# Patient Record
Sex: Female | Born: 1955 | Race: White | Hispanic: No | Smoking: Never smoker
Health system: Southern US, Community
[De-identification: ages and names within clinical notes are randomized; demographics above are authoritative.]

## PROBLEM LIST (undated history)

## (undated) DIAGNOSIS — F32A Depression, unspecified: Secondary | ICD-10-CM

## (undated) DIAGNOSIS — N3281 Overactive bladder: Secondary | ICD-10-CM

## (undated) DIAGNOSIS — E119 Type 2 diabetes mellitus without complications: Secondary | ICD-10-CM

## (undated) DIAGNOSIS — I700 Atherosclerosis of aorta: Secondary | ICD-10-CM

## (undated) HISTORY — DX: Type 2 diabetes mellitus without complications: E11.9

## (undated) HISTORY — DX: Overactive bladder: N32.81

## (undated) HISTORY — DX: Atherosclerosis of aorta: I70.0

## (undated) HISTORY — DX: Depression, unspecified: F32.A

---

## 1997-08-16 ENCOUNTER — Encounter: Admission: RE | Admit: 1997-08-16 | Discharge: 1997-08-16 | Payer: Self-pay | Admitting: Family Medicine

## 1997-08-28 ENCOUNTER — Encounter: Admission: RE | Admit: 1997-08-28 | Discharge: 1997-08-28 | Payer: Self-pay | Admitting: Family Medicine

## 1997-12-08 ENCOUNTER — Encounter: Admission: RE | Admit: 1997-12-08 | Discharge: 1997-12-08 | Payer: Self-pay | Admitting: Family Medicine

## 1998-01-11 ENCOUNTER — Ambulatory Visit (HOSPITAL_COMMUNITY): Admission: RE | Admit: 1998-01-11 | Discharge: 1998-01-11 | Payer: Self-pay | Admitting: Family Medicine

## 1999-02-21 ENCOUNTER — Other Ambulatory Visit: Admission: RE | Admit: 1999-02-21 | Discharge: 1999-02-21 | Payer: Self-pay | Admitting: Obstetrics & Gynecology

## 1999-10-11 ENCOUNTER — Encounter: Payer: Self-pay | Admitting: Family Medicine

## 1999-10-11 ENCOUNTER — Ambulatory Visit (HOSPITAL_COMMUNITY): Admission: RE | Admit: 1999-10-11 | Discharge: 1999-10-11 | Payer: Self-pay | Admitting: Family Medicine

## 2000-05-28 ENCOUNTER — Other Ambulatory Visit: Admission: RE | Admit: 2000-05-28 | Discharge: 2000-05-28 | Payer: Self-pay | Admitting: Obstetrics & Gynecology

## 2001-05-31 ENCOUNTER — Other Ambulatory Visit: Admission: RE | Admit: 2001-05-31 | Discharge: 2001-05-31 | Payer: Self-pay | Admitting: Obstetrics & Gynecology

## 2002-06-15 ENCOUNTER — Other Ambulatory Visit: Admission: RE | Admit: 2002-06-15 | Discharge: 2002-06-15 | Payer: Self-pay | Admitting: Obstetrics & Gynecology

## 2003-07-24 ENCOUNTER — Other Ambulatory Visit: Admission: RE | Admit: 2003-07-24 | Discharge: 2003-07-24 | Payer: Self-pay | Admitting: Obstetrics & Gynecology

## 2004-08-14 ENCOUNTER — Other Ambulatory Visit: Admission: RE | Admit: 2004-08-14 | Discharge: 2004-08-14 | Payer: Self-pay | Admitting: Obstetrics & Gynecology

## 2006-10-13 ENCOUNTER — Encounter: Admission: RE | Admit: 2006-10-13 | Discharge: 2006-10-13 | Payer: Self-pay | Admitting: Obstetrics & Gynecology

## 2007-06-16 ENCOUNTER — Observation Stay (HOSPITAL_COMMUNITY): Admission: EM | Admit: 2007-06-16 | Discharge: 2007-06-17 | Payer: Self-pay | Admitting: Emergency Medicine

## 2007-11-17 ENCOUNTER — Encounter: Admission: RE | Admit: 2007-11-17 | Discharge: 2007-11-17 | Payer: Self-pay | Admitting: Obstetrics & Gynecology

## 2008-12-07 ENCOUNTER — Encounter: Admission: RE | Admit: 2008-12-07 | Discharge: 2008-12-07 | Payer: Self-pay | Admitting: Obstetrics & Gynecology

## 2009-11-20 ENCOUNTER — Emergency Department (HOSPITAL_COMMUNITY): Admission: EM | Admit: 2009-11-20 | Discharge: 2009-11-21 | Payer: Self-pay | Admitting: Emergency Medicine

## 2010-02-18 ENCOUNTER — Encounter: Admission: RE | Admit: 2010-02-18 | Discharge: 2010-02-18 | Payer: Self-pay | Admitting: Obstetrics & Gynecology

## 2010-05-30 LAB — BASIC METABOLIC PANEL
BUN: 20 mg/dL (ref 6–23)
Chloride: 104 mEq/L (ref 96–112)
GFR calc non Af Amer: >60 mL/min (ref >60)
Glucose, Bld: 142 mg/dL — ABNORMAL HIGH (ref 70–99)
Potassium: 3.5 mEq/L (ref 3.5–5.1)

## 2010-05-30 LAB — DIFFERENTIAL
Basophils Absolute: 0.1 10*3/uL (ref 0.0–0.1)
Basophils Relative: 1 % (ref 0–1)
Eosinophils Relative: 4 % (ref 0–5)
Monocytes Absolute: 1 10*3/uL (ref 0.1–1.0)

## 2010-05-30 LAB — CBC
HCT: 38.1 % (ref 36.0–46.0)
MCHC: 33.8 g/dL (ref 30.0–36.0)
MCV: 87.1 fL (ref 78.0–100.0)
RDW: 14.3 % (ref 11.5–15.5)

## 2010-07-30 NOTE — Discharge Summary (Signed)
Brittany Brady, Brittany Brady NO.:  [id number]   MEDICAL RECORD NO.:  [id number]          PATIENT TYPE:  INP   LOCATION:  6524                         FACILITY:  [facility]   PHYSICIAN:  Jake Bathe, MD      DATE OF BIRTH:  02-04-1956   DATE OF ADMISSION:  06/16/2007  DATE OF DISCHARGE:  06/17/2007                               DISCHARGE SUMMARY   DISCHARGE DIAGNOSES:  1. Chest pain - noncardiac.  2. Hypertension.  3. Hyperlipidemia - LDL 122.  4. Strong family history of coronary artery disease.  5. Depression, anxiety.  6. Gastroesophageal reflux disease.  7. 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  Pain lasted 10-15 minutes, subsided, was  4/10 when EMS arrived, and was alleviated when I saw her in the  emergency department.   All 3 sets of cardiac biomarkers were normal.  EKG was reassuring and  normal x2.  Nuclear stress test, low risk as above.  D-dimer was normal.   This morning feels good, no further chest pain, just tired, able to  ambulate well, no shortness of breath, no syncope .  On telemetry 2  PVCs/couplet.   PHYSICAL EXAMINATION:  VITAL SIGNS:  Blood pressure on discharge 121/64,  heart rate 80, respirations 27, sating 97% on room air, afebrile.  GENERAL:  Alert and oriented x3.  No acute distress.  CARDIOVASCULAR:  Regular rate and rhythm.  No murmurs, rubs, or gallops.  LUNGS:  Clear to auscultation bilaterally.  Normal respiratory effort.  ABDOMEN:  Soft, nontender.  Normoactive bowel sounds.  EXTREMITIES:  No clubbing, cyanosis, or edema.   LABORATORY DATA:  As above, hemoglobin 12.9, hematocrit 38.0, creatinine  1.1, and  potassium 3.3 slightly low.   DISCHARGE MEDICATIONS:  1. Aspirin 81 mg once a day none.  2. Metoprolol 25 mg twice a day (new).  3. Benicar HCT 40/25 mg once a day.  4. Zocor 20 mg p.o. q.h.s. (new).  5. Protonix 40 mg once a day (new).  6. Potassium chloride 20 mEq p.o. daily (new).   DISCHARGE INSTRUCTIONS:  No restrictions on activity.   FOLLOWUP:  She has a followup appointment on Wednesday, June 30, 2007,  10:15 a.m. with Dr. Donato Schultz at [facility].  She needs to call  me if any other worrisome symptoms occur.      Jake Bathe, MD  Electronically Signed     MCS/MEDQ  D:  06/17/2007  T:  06/17/2007  Job:  [id number]   cc:   Elana Alm. [name], M.D.

## 2010-07-30 NOTE — H&P (Signed)
Brittany Brady, BARGA NO.:  [id number]   MEDICAL RECORD NO.:  [id number]          PATIENT TYPE:  EMS   LOCATION:  MAJO                         FACILITY:  [facility]   PHYSICIAN:  Jake Bathe, MD      DATE OF BIRTH:  Nov 06, 1955   DATE OF ADMISSION:  06/16/2007  DATE OF DISCHARGE:                              HISTORY & PHYSICAL   PRIMARY CARE PHYSICIAN:  Brittany Brady, M.D.   REASON FOR ADMISSION:  Possible Unstable angina.   HISTORY OF PRESENT ILLNESS:  A 55 year old female with hypertension,  early family history of coronary artery disease with her mother dying at  age 41 from presumed aortic aneurysm, GERD, depression, neuropathy who  had sudden onset, 8/10, substernal chest pain which radiated to both of  her shoulders bilaterally occurring today while standing at work.  She  sat down, noted some mild shortness of breath, and called 911.  After  about 10 to 15 minutes, pain subsided to a 4/10 when EMS arrived.  She  was given appropriate aspirin and nitroglycerin and currently, she is  chest pain free with only mild fleeting episodes of discomfort.  Shortness of breath is relieved.   In October 2008, she had a stress Cardiolite where she exercised for 7  minutes 6 seconds with normal blood pressure response and no ischemic  changes on EKG.  The overall risk of the test was low with no myocardial  perfusion defects.  She also had echocardiogram which showed normal  ejection fraction, diastolic dysfunction, and mild mitral regurgitation  with normal aortic valve.   PAST MEDICAL HISTORY:  1. Hypertension.  2. GERD.  3. 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  4. Depression.   ALLERGIES:  No known drug allergies.   MEDICATIONS:  1. Oral contraceptive pill.  2. Benicar HCT 40/25 mg once a day.  3. Cymbalta 60 mg once a day.  4. Ranitidine 300 mg twice a day.  5. Antihistamine occasionally.   SOCIAL HISTORY:  Denies any tobacco use.  Very  infrequent alcohol use.  No illicit drug use.  She works as an Psychologist, forensic.  Her  husband died on the job as a Psychologist, counselling.  Her mother died  during the week of her honeymoon in 11.  She has 2 girls in college.   FAMILY HISTORY:  Mother, possible aortic aneurysm 69, died at age 41.  Both of her brothers have diabetes mellitus.  Father died of lung cancer  earlier in 2008.   REVIEW OF SYSTEMS:  No bleeding.  Occasional fatigue.  Unless stated,  above all other 12 review of systems negative.  No headaches.   PHYSICAL EXAMINATION:  VITAL SIGNS: Temperature 98, on arrival pulse 93,  blood pressure 147/96, saturating 97% on room air.  GENERAL: Alert and oriented x3.  No acute distress.  Lying comfortably  in bed, here with her friend.  EYES: Well-perfused conjunctivae, EOMI.  No scleral icterus.  NECK:  Supple.  No JVD.  No carotid bruits.  No thyromegaly.  CARDIOVASCULAR:  Regular rate  and rhythm with no murmurs, rubs, or  gallops.  No RV heave.  Normal PMI.  LUNGS:  Clear to auscultation bilaterally.  Normal respiratory effort.  ABDOMEN:  Soft and nontender.  Normal active bowel sounds.  No rebound.  No guarding.  No bruits.  EXTREMITIES:  No clubbing, cyanosis, or edema.  Normal distal pulses.  NEUROLOGIC:  Nonfocal.  No tremors.  SKIN:  Warm, dry, and intact.  No rashes.   DATA:  EKG here demonstrates normal sinus rhythm with no other  abnormalities.  Heart rate 91.  EKG from office demonstrated borderline  LVH, was otherwise normal.  Nuclear stress as above, low risk.  Echo,  normal EF.  Recent LDL 128, triglycerides 111, HDL 69.  TSH was normal.  Hemoglobin 13.  Here in ER, D-dimer pending.  First set of cardiac  biomarkers normal.  PT  and PTT normal.  Potassium was 3.3.  Chest x-ray  personally reviewed shows possible bibasilar atelectasis with no other  acute airspace disease.   ASSESSMENT/PLAN:  A 55 year old female with early family history  of  cardiovascular disease, hypertension with chest pain concerning for  possible unstable angina.  1. Possible Unstable angina - Given risk factors, we will place on      Lovenox q.12 hour, ACS dose.  Previous nuclear stress test      reassuring in October.  We will continue to cycle cardiac enzymes      to detect any evidence of myocardial infarction.  EKG currently      reassuring.  We will make NPO past midnight in case further testing      is required.  Nitroglycerin p.r.n., oxygen.  We will also start low-      dose beta-blocker, metoprolol 25 mg twice a day.  Dyspnea and chest      pain are both improved.  I will order D-dimer.  I believe she is      quite low likelihood for pulmonary embolism.  2. Hypertension - We will continue Benicar HCT, added metoprolol 25 mg      twice a day.  3. Hypokalemia - Potassium replacement administered.  4. Gastroesophageal reflux disease - We will place on Protonix instead      of  ranitidine.  5. We will follow up with lab results.      Jake Bathe, MD  Electronically Signed     MCS/MEDQ  D:  06/16/2007  T:  06/17/2007  Job:  [id number]   cc:   Brittany Brady, M.D.

## 2010-12-10 LAB — POCT CARDIAC MARKERS: CKMB, poc: <1 — ABNORMAL LOW

## 2010-12-10 LAB — CARDIAC PANEL(CRET KIN+CKTOT+MB+TROPI)
CK, MB: 1.2
Troponin I: 0.01

## 2010-12-10 LAB — CBC
HCT: 39.2
MCV: 89.2
Platelets: 242
WBC: 7

## 2010-12-10 LAB — POCT I-STAT, CHEM 8
Creatinine, Ser: 1.1
Glucose, Bld: 98
Hemoglobin: 12.9
TCO2: 28

## 2010-12-10 LAB — CK TOTAL AND CKMB (NOT AT ARMC)
CK, MB: 1.3
Total CK: 72

## 2010-12-10 LAB — TROPONIN I: Troponin I: <0.01

## 2011-02-19 ENCOUNTER — Other Ambulatory Visit: Payer: Self-pay | Admitting: Obstetrics & Gynecology

## 2011-02-19 DIAGNOSIS — Z1231 Encounter for screening mammogram for malignant neoplasm of breast: Secondary | ICD-10-CM

## 2011-02-25 ENCOUNTER — Ambulatory Visit
Admission: RE | Admit: 2011-02-25 | Discharge: 2011-02-25 | Disposition: A | Discharge status: Home / Self Care | Payer: BC Managed Care – PPO | Source: Ambulatory Visit | Attending: Obstetrics & Gynecology | Admitting: Obstetrics & Gynecology

## 2011-02-25 DIAGNOSIS — Z1231 Encounter for screening mammogram for malignant neoplasm of breast: Secondary | ICD-10-CM

## 2012-01-20 ENCOUNTER — Other Ambulatory Visit: Payer: Self-pay | Admitting: Obstetrics & Gynecology

## 2012-01-20 DIAGNOSIS — Z1231 Encounter for screening mammogram for malignant neoplasm of breast: Secondary | ICD-10-CM

## 2012-02-26 ENCOUNTER — Ambulatory Visit
Admission: RE | Admit: 2012-02-26 | Discharge: 2012-02-26 | Disposition: A | Discharge status: Home / Self Care | Payer: BC Managed Care – PPO | Source: Ambulatory Visit | Attending: Obstetrics & Gynecology | Admitting: Obstetrics & Gynecology

## 2012-02-26 DIAGNOSIS — Z1231 Encounter for screening mammogram for malignant neoplasm of breast: Secondary | ICD-10-CM

## 2013-02-15 ENCOUNTER — Other Ambulatory Visit: Payer: Self-pay

## 2013-02-15 DIAGNOSIS — Z1231 Encounter for screening mammogram for malignant neoplasm of breast: Secondary | ICD-10-CM

## 2013-03-18 ENCOUNTER — Ambulatory Visit
Admission: RE | Admit: 2013-03-18 | Discharge: 2013-03-18 | Disposition: A | Discharge status: Home / Self Care | Payer: BC Managed Care – PPO | Source: Ambulatory Visit

## 2013-03-18 DIAGNOSIS — Z1231 Encounter for screening mammogram for malignant neoplasm of breast: Secondary | ICD-10-CM

## 2014-03-22 ENCOUNTER — Other Ambulatory Visit: Payer: Self-pay

## 2014-03-22 DIAGNOSIS — Z1231 Encounter for screening mammogram for malignant neoplasm of breast: Secondary | ICD-10-CM

## 2014-04-06 ENCOUNTER — Ambulatory Visit
Admission: RE | Admit: 2014-04-06 | Discharge: 2014-04-06 | Disposition: A | Discharge status: Home / Self Care | Payer: BC Managed Care – PPO | Source: Ambulatory Visit

## 2014-04-06 DIAGNOSIS — Z1231 Encounter for screening mammogram for malignant neoplasm of breast: Secondary | ICD-10-CM

## 2014-05-26 ENCOUNTER — Other Ambulatory Visit (HOSPITAL_COMMUNITY): Payer: Self-pay | Admitting: Orthopaedic Surgery

## 2014-05-26 DIAGNOSIS — M25561 Pain in right knee: Secondary | ICD-10-CM

## 2014-06-13 ENCOUNTER — Ambulatory Visit (HOSPITAL_COMMUNITY)
Admission: RE | Admit: 2014-06-13 | Discharge: 2014-06-13 | Disposition: A | Discharge status: Home / Self Care | Payer: BC Managed Care – PPO | Source: Ambulatory Visit | Attending: Orthopaedic Surgery | Admitting: Orthopaedic Surgery

## 2014-06-13 DIAGNOSIS — X58XXXA Exposure to other specified factors, initial encounter: Secondary | ICD-10-CM | POA: Diagnosis not present

## 2014-06-13 DIAGNOSIS — S83241A Other tear of medial meniscus, current injury, right knee, initial encounter: Secondary | ICD-10-CM | POA: Insufficient documentation

## 2014-06-13 DIAGNOSIS — M25561 Pain in right knee: Secondary | ICD-10-CM | POA: Diagnosis present

## 2015-02-23 ENCOUNTER — Other Ambulatory Visit: Payer: Self-pay

## 2015-02-23 DIAGNOSIS — Z1231 Encounter for screening mammogram for malignant neoplasm of breast: Secondary | ICD-10-CM

## 2015-04-09 ENCOUNTER — Ambulatory Visit
Admission: RE | Admit: 2015-04-09 | Discharge: 2015-04-09 | Disposition: A | Discharge status: Home / Self Care | Payer: BC Managed Care – PPO | Source: Ambulatory Visit

## 2015-04-09 DIAGNOSIS — Z1231 Encounter for screening mammogram for malignant neoplasm of breast: Secondary | ICD-10-CM

## 2016-05-27 ENCOUNTER — Other Ambulatory Visit: Payer: Self-pay | Admitting: Obstetrics & Gynecology

## 2016-05-27 DIAGNOSIS — Z1231 Encounter for screening mammogram for malignant neoplasm of breast: Secondary | ICD-10-CM

## 2016-06-16 ENCOUNTER — Ambulatory Visit
Admission: RE | Admit: 2016-06-16 | Discharge: 2016-06-16 | Disposition: A | Discharge status: Home / Self Care | Payer: BC Managed Care – PPO | Source: Ambulatory Visit | Attending: Obstetrics & Gynecology | Admitting: Obstetrics & Gynecology

## 2016-06-16 DIAGNOSIS — Z1231 Encounter for screening mammogram for malignant neoplasm of breast: Secondary | ICD-10-CM

## 2016-09-24 ENCOUNTER — Ambulatory Visit (INDEPENDENT_AMBULATORY_CARE_PROVIDER_SITE_OTHER): Payer: Self-pay

## 2016-09-24 ENCOUNTER — Encounter (INDEPENDENT_AMBULATORY_CARE_PROVIDER_SITE_OTHER): Payer: Self-pay | Admitting: Orthopedic Surgery

## 2016-09-24 ENCOUNTER — Ambulatory Visit (INDEPENDENT_AMBULATORY_CARE_PROVIDER_SITE_OTHER): Payer: BC Managed Care – PPO | Admitting: Orthopedic Surgery

## 2016-09-24 VITALS — BP 130/80 | HR 92 | Ht 65.0 in | Wt 185.0 lb

## 2016-09-24 DIAGNOSIS — M1711 Unilateral primary osteoarthritis, right knee: Secondary | ICD-10-CM

## 2016-09-24 DIAGNOSIS — M25561 Pain in right knee: Secondary | ICD-10-CM

## 2016-09-24 DIAGNOSIS — G8929 Other chronic pain: Secondary | ICD-10-CM

## 2016-09-24 MED ORDER — BUPIVACAINE HCL 0.5 % IJ SOLN
3.0000 mL | INTRAMUSCULAR | Status: AC | PRN
  Administered 2016-09-24: 3 mL via INTRA_ARTICULAR

## 2016-09-24 MED ORDER — METHYLPREDNISOLONE ACETATE 40 MG/ML IJ SUSP
80.0000 mg | INTRAMUSCULAR | Status: AC | PRN
  Administered 2016-09-24: 80 mg

## 2016-09-24 MED ORDER — LIDOCAINE HCL 2 % IJ SOLN
4.0000 mL | INTRAMUSCULAR | Status: AC | PRN
  Administered 2016-09-24: 4 mL

## 2016-09-24 NOTE — Progress Notes (Signed)
Office Visit Note   Patient: Brittany Brady           Date of Birth: Jul 29, 1955           MRN: [id number] Visit Date: 09/24/2016              Requested by: Brittany Elseeade, Robert, MD [phone] Brittany Brady [phone] PCP: Brittany Elseeade, Robert, MD   Assessment & Plan: Visit Diagnoses:  1. Unilateral primary osteoarthritis, right knee   2. Chronic pain of right knee     Plan:  #1: Corticosteroid injection to the right knee #2: Follow back up when necessary  Follow-Up Instructions: Return if symptoms worsen or fail to improve.   Orders:  Orders Placed This Encounter  Procedures  . XR Knee Complete 4 Views Right   No orders of the defined types were placed in this encounter.     Procedures: Large Joint Inj Date/Time: 09/24/2016 3:10 PM Performed by: Brittany Brady Authorized by: Brittany Brady   Consent Given by:  Patient Timeout: prior to procedure the correct patient, procedure, and site was verified   Indications:  Pain and joint swelling Location:  Knee Site:  R knee Prep: patient was prepped and draped in usual sterile fashion   Needle Size:  25 G Needle Length:  1.5 inches Approach:  Anteromedial Ultrasound Guidance: No   Fluoroscopic Guidance: No   Arthrogram: No   Medications:  80 mg methylPREDNISolone acetate 40 MG/ML; 3 mL bupivacaine 0.5 %; 4 mL lidocaine 2 % Aspiration Attempted: No   Patient tolerance:  Patient tolerated the procedure well with no immediate complications     Clinical Data: No additional findings.   Subjective: Chief Complaint  Patient presents with  . Right Knee - Pain  . Injections    Right knee pain 2 months. takes naproxen with some help. previous injections were helpful. no new injury.     Brittany Brady is a very pleasant 61 year old white female who presents today with right knee pain. She's had this over the past 2 months has been taking naproxen 2 tablets twice a day which has some benefit. She however is having  worsening pain and would like to consider having a corticosteroid injection to the right knee. According to the chart last time we saw her was in May 2016 and that was follow-up after a right knee arthroscopy where she had a tear of the medial meniscus and some medial compartment arthritis with diffuse grade 2 and some early grade 3 changes medial compartment. She certainly has done well overall. Seen today for evaluation.       Review of Systems  All other systems reviewed and are negative.    Objective: Vital Signs: BP 130/80   Pulse 92   Ht 5\' 5"  (1.651 m)   Wt 185 lb (83.9 kg)   BMI 30.79 kg/m   Physical Exam  Constitutional: She is oriented to person, place, and time. She appears well-developed and well-nourished.  HENT:  Head: Normocephalic and atraumatic.  Eyes: EOM are normal. Pupils are equal, round, and reactive to light.  Pulmonary/Chest: Effort normal.  Neurological: She is alert and oriented to person, place, and time.  Skin: Skin is warm and dry.  Psychiatric: She has a normal mood and affect. Her behavior is normal. Judgment and thought content normal.    Ortho Exam  Today she has a trace effusion. Range of motion from near full extension to about 110. Some crepitus  with range of motion but this is minimal. Pain mainly along the medial compartment. An along the lateral parapatellar area.  Specialty Comments:  No specialty comments available.  Imaging: Xr Knee Complete 4 Views Right  Result Date: 09/24/2016 4 view x-ray of the right knee reveals marked narrowing of the medial joint space. On the 45 flexed there certainly is some spurring noted a little bit more on the medial tibial plateau than the medial femoral condyle. Sclerosing of the tibial plateau medially is also noted. Flattening of the lateral femoral condyle is noted on the AP. There is calcification along the lateral aspect of the patellofemoral joint and the sunrise view.    PMFS  History: There are no active problems to display for this patient.  No past medical history on file.  No family history on file.  No past surgical history on file. Social History   Occupational History  . Not on file.   Social History Main Topics  . Smoking status: Never Smoker  . Smokeless tobacco: Never Used  . Alcohol use 1.8 oz/week    2 Glasses of wine, 1 Cans of beer per week  . Drug use: No  . Sexual activity: Not on file

## 2016-09-30 ENCOUNTER — Ambulatory Visit (INDEPENDENT_AMBULATORY_CARE_PROVIDER_SITE_OTHER): Payer: Self-pay | Admitting: Orthopaedic Surgery

## 2017-07-08 ENCOUNTER — Other Ambulatory Visit: Payer: Self-pay | Admitting: Obstetrics & Gynecology

## 2017-07-08 DIAGNOSIS — Z1231 Encounter for screening mammogram for malignant neoplasm of breast: Secondary | ICD-10-CM

## 2017-07-10 ENCOUNTER — Ambulatory Visit
Admission: RE | Admit: 2017-07-10 | Discharge: 2017-07-10 | Disposition: A | Discharge status: Home / Self Care | Payer: BC Managed Care – PPO | Source: Ambulatory Visit | Attending: Obstetrics & Gynecology | Admitting: Obstetrics & Gynecology

## 2017-07-10 DIAGNOSIS — Z1231 Encounter for screening mammogram for malignant neoplasm of breast: Secondary | ICD-10-CM

## 2017-07-13 ENCOUNTER — Other Ambulatory Visit: Payer: Self-pay | Admitting: Obstetrics & Gynecology

## 2017-07-13 DIAGNOSIS — R928 Other abnormal and inconclusive findings on diagnostic imaging of breast: Secondary | ICD-10-CM

## 2017-07-15 ENCOUNTER — Other Ambulatory Visit: Payer: Self-pay | Admitting: Obstetrics & Gynecology

## 2017-07-15 ENCOUNTER — Ambulatory Visit
Admission: RE | Admit: 2017-07-15 | Discharge: 2017-07-15 | Disposition: A | Discharge status: Home / Self Care | Payer: BC Managed Care – PPO | Source: Ambulatory Visit | Attending: Obstetrics & Gynecology | Admitting: Obstetrics & Gynecology

## 2017-07-15 DIAGNOSIS — R928 Other abnormal and inconclusive findings on diagnostic imaging of breast: Secondary | ICD-10-CM

## 2017-07-15 DIAGNOSIS — N6320 Unspecified lump in the left breast, unspecified quadrant: Secondary | ICD-10-CM

## 2018-01-18 ENCOUNTER — Other Ambulatory Visit: Payer: Self-pay | Admitting: Obstetrics & Gynecology

## 2018-01-18 ENCOUNTER — Ambulatory Visit
Admission: RE | Admit: 2018-01-18 | Discharge: 2018-01-18 | Disposition: A | Discharge status: Home / Self Care | Payer: BC Managed Care – PPO | Source: Ambulatory Visit | Attending: Obstetrics & Gynecology | Admitting: Obstetrics & Gynecology

## 2018-01-18 DIAGNOSIS — N6320 Unspecified lump in the left breast, unspecified quadrant: Secondary | ICD-10-CM

## 2018-07-19 ENCOUNTER — Ambulatory Visit
Admission: RE | Admit: 2018-07-19 | Discharge: 2018-07-19 | Disposition: A | Discharge status: Home / Self Care | Payer: BC Managed Care – PPO | Source: Ambulatory Visit | Attending: Obstetrics & Gynecology | Admitting: Obstetrics & Gynecology

## 2018-07-19 ENCOUNTER — Other Ambulatory Visit: Payer: Self-pay

## 2018-07-19 DIAGNOSIS — N6320 Unspecified lump in the left breast, unspecified quadrant: Secondary | ICD-10-CM

## 2019-02-07 ENCOUNTER — Other Ambulatory Visit: Payer: Self-pay | Admitting: Family Medicine

## 2019-02-07 DIAGNOSIS — N6320 Unspecified lump in the left breast, unspecified quadrant: Secondary | ICD-10-CM

## 2019-05-17 ENCOUNTER — Telehealth: Payer: Self-pay | Admitting: Orthopaedic Surgery

## 2019-05-17 NOTE — Telephone Encounter (Signed)
Patient called requesting copy of records 2016-present [facility]). Wants to pick up when she comes in to sign release form. I told her I would have them ready tomorrow and the release form to sign when she comes to pick up

## 2019-07-21 ENCOUNTER — Other Ambulatory Visit: Payer: BC Managed Care – PPO

## 2019-07-27 ENCOUNTER — Other Ambulatory Visit: Payer: BC Managed Care – PPO

## 2019-08-03 ENCOUNTER — Ambulatory Visit
Admission: RE | Admit: 2019-08-03 | Discharge: 2019-08-03 | Disposition: A | Discharge status: Home / Self Care | Payer: BC Managed Care – PPO | Source: Ambulatory Visit | Attending: Family Medicine | Admitting: Family Medicine

## 2019-08-03 ENCOUNTER — Other Ambulatory Visit: Payer: Self-pay

## 2019-08-03 DIAGNOSIS — N6320 Unspecified lump in the left breast, unspecified quadrant: Secondary | ICD-10-CM

## 2019-12-15 ENCOUNTER — Ambulatory Visit (HOSPITAL_COMMUNITY)
Admission: RE | Admit: 2019-12-15 | Discharge: 2019-12-15 | Disposition: A | Discharge status: Home / Self Care | Payer: BC Managed Care – PPO | Source: Ambulatory Visit | Attending: Pulmonary Disease | Admitting: Pulmonary Disease

## 2019-12-15 ENCOUNTER — Other Ambulatory Visit: Payer: Self-pay | Admitting: Oncology

## 2019-12-15 DIAGNOSIS — U071 COVID-19: Secondary | ICD-10-CM | POA: Insufficient documentation

## 2019-12-15 MED ORDER — FAMOTIDINE IN NACL 20-0.9 MG/50ML-% IV SOLN: 20.0000 mg | Freq: Once | INTRAVENOUS | Status: DC | PRN

## 2019-12-15 MED ORDER — EPINEPHRINE 0.3 MG/0.3ML IJ SOAJ: 0.3000 mg | Freq: Once | INTRAMUSCULAR | Status: DC | PRN

## 2019-12-15 MED ORDER — SODIUM CHLORIDE 0.9 % IV SOLN
1200.0000 mg | Freq: Once | INTRAVENOUS | Status: AC
  Administered 2019-12-15: 1200 mg via INTRAVENOUS

## 2019-12-15 MED ORDER — DIPHENHYDRAMINE HCL 50 MG/ML IJ SOLN: 50.0000 mg | Freq: Once | INTRAMUSCULAR | Status: DC | PRN

## 2019-12-15 MED ORDER — METHYLPREDNISOLONE SODIUM SUCC 125 MG IJ SOLR: 125.0000 mg | Freq: Once | INTRAMUSCULAR | Status: DC | PRN

## 2019-12-15 MED ORDER — SODIUM CHLORIDE 0.9 % IV SOLN: INTRAVENOUS | Status: DC | PRN

## 2019-12-15 MED ORDER — ALBUTEROL SULFATE HFA 108 (90 BASE) MCG/ACT IN AERS: 2.0000 | INHALATION_SPRAY | Freq: Once | RESPIRATORY_TRACT | Status: DC | PRN

## 2019-12-15 NOTE — Progress Notes (Signed)
  Diagnosis: COVID-19  Physician: Dr. Wright  Procedure: [facility] Med: casirivimab\imdevimab infusion - Provided patient with casirivimab\imdevimab fact sheet for patients, parents and caregivers prior to infusion.  Complications: No immediate complications noted.  Discharge: Discharged home   Clive Parcel J 12/15/2019  

## 2019-12-15 NOTE — Progress Notes (Signed)
I connected by phone with  Mrs. Nawabi  to discuss the potential use of an new treatment for mild to moderate COVID-19 viral infection in non-hospitalized patients.   This patient is a age/sex that meets the FDA criteria for Emergency Use Authorization of casirivimab\imdevimab.  Has a (+) direct SARS-CoV-2 viral test result 1. Has mild or moderate COVID-19  2. Is ? 62 years of age and weighs ? 40 kg 3. Is NOT hospitalized due to COVID-19 4. Is NOT requiring oxygen therapy or requiring an increase in baseline oxygen flow rate due to COVID-19 5. Is within 10 days of symptom onset 6. Has at least one of the high risk factor(s) for progression to severe COVID-19 and/or hospitalization as defined in EUA. ? Specific high risk criteria :No past medical history on file. ? DM, BMI 28   Symptom onset  12/12/19   I have spoken and communicated the following to the patient or parent/caregiver:   1. FDA has authorized the emergency use of casirivimab\imdevimab for the treatment of mild to moderate COVID-19 in adults and pediatric patients with positive results of direct SARS-CoV-2 viral testing who are 109 years of age and older weighing at least 40 kg, and who are at high risk for progressing to severe COVID-19 and/or hospitalization.   2. The significant known and potential risks and benefits of casirivimab\imdevimab, and the extent to which such potential risks and benefits are unknown.   3. Information on available alternative treatments and the risks and benefits of those alternatives, including clinical trials.   4. Patients treated with casirivimab\imdevimab should continue to self-isolate and use infection control measures (e.g., wear mask, isolate, social distance, avoid sharing personal items, clean and disinfect "high touch" surfaces, and frequent handwashing) according to CDC guidelines.    5. The patient or parent/caregiver has the option to accept or refuse casirivimab\imdevimab .   After  reviewing this information with the patient, The patient agreed to proceed with receiving casirivimab\imdevimab infusion and will be provided a copy of the Fact sheet prior to receiving the infusion.Mignon Pine, AGNP-C [phone] (Infusion Center Hotline)

## 2019-12-15 NOTE — Discharge Instructions (Signed)

## 2020-02-29 ENCOUNTER — Ambulatory Visit: Payer: BC Managed Care – PPO | Admitting: Orthopaedic Surgery

## 2020-07-16 ENCOUNTER — Other Ambulatory Visit: Payer: Self-pay | Admitting: Family Medicine

## 2020-07-16 DIAGNOSIS — Z1231 Encounter for screening mammogram for malignant neoplasm of breast: Secondary | ICD-10-CM

## 2020-09-05 ENCOUNTER — Other Ambulatory Visit: Payer: Self-pay

## 2020-09-05 ENCOUNTER — Ambulatory Visit
Admission: RE | Admit: 2020-09-05 | Discharge: 2020-09-05 | Disposition: A | Discharge status: Home / Self Care | Payer: BC Managed Care – PPO | Source: Ambulatory Visit | Attending: Family Medicine | Admitting: Family Medicine

## 2020-09-05 DIAGNOSIS — Z1231 Encounter for screening mammogram for malignant neoplasm of breast: Secondary | ICD-10-CM

## 2021-08-09 ENCOUNTER — Other Ambulatory Visit: Payer: Self-pay | Admitting: Family Medicine

## 2021-08-09 DIAGNOSIS — Z1231 Encounter for screening mammogram for malignant neoplasm of breast: Secondary | ICD-10-CM

## 2021-09-06 ENCOUNTER — Ambulatory Visit
Admission: RE | Admit: 2021-09-06 | Discharge: 2021-09-06 | Disposition: A | Discharge status: Home / Self Care | Payer: BC Managed Care – PPO | Source: Ambulatory Visit | Attending: Family Medicine | Admitting: Family Medicine

## 2021-09-06 DIAGNOSIS — Z1231 Encounter for screening mammogram for malignant neoplasm of breast: Secondary | ICD-10-CM

## 2021-12-11 IMAGING — MG MM DIGITAL SCREENING BILAT W/ TOMO AND CAD
6 of 10 series · 6 of 30 positions shown · non-contrast
Comparison: Previous exam(s).

CLINICAL DATA: Screening.

EXAM:
DIGITAL SCREENING BILATERAL MAMMOGRAM WITH TOMOSYNTHESIS AND CAD
TECHNIQUE: Bilateral screening digital craniocaudal and mediolateral oblique
mammograms were obtained. Bilateral screening digital breast
tomosynthesis was performed. The images were evaluated with
computer-aided detection.

[R CC synth-2D]
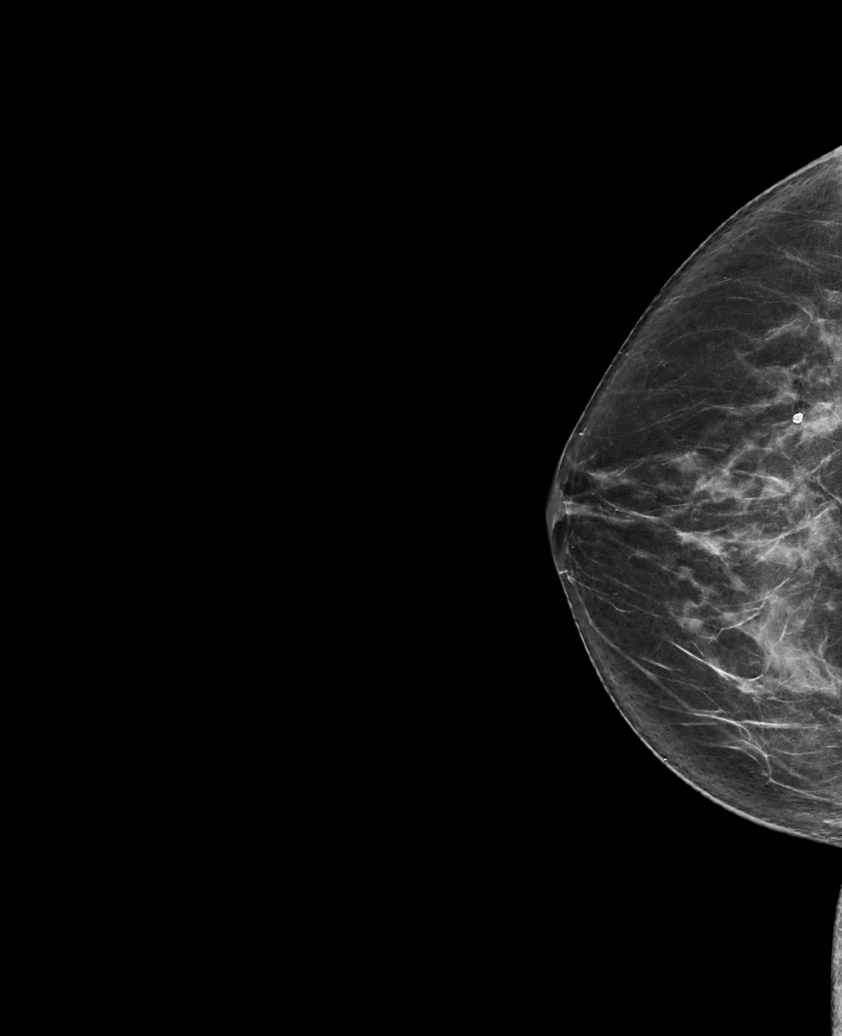

[R XCCL synth-2D]
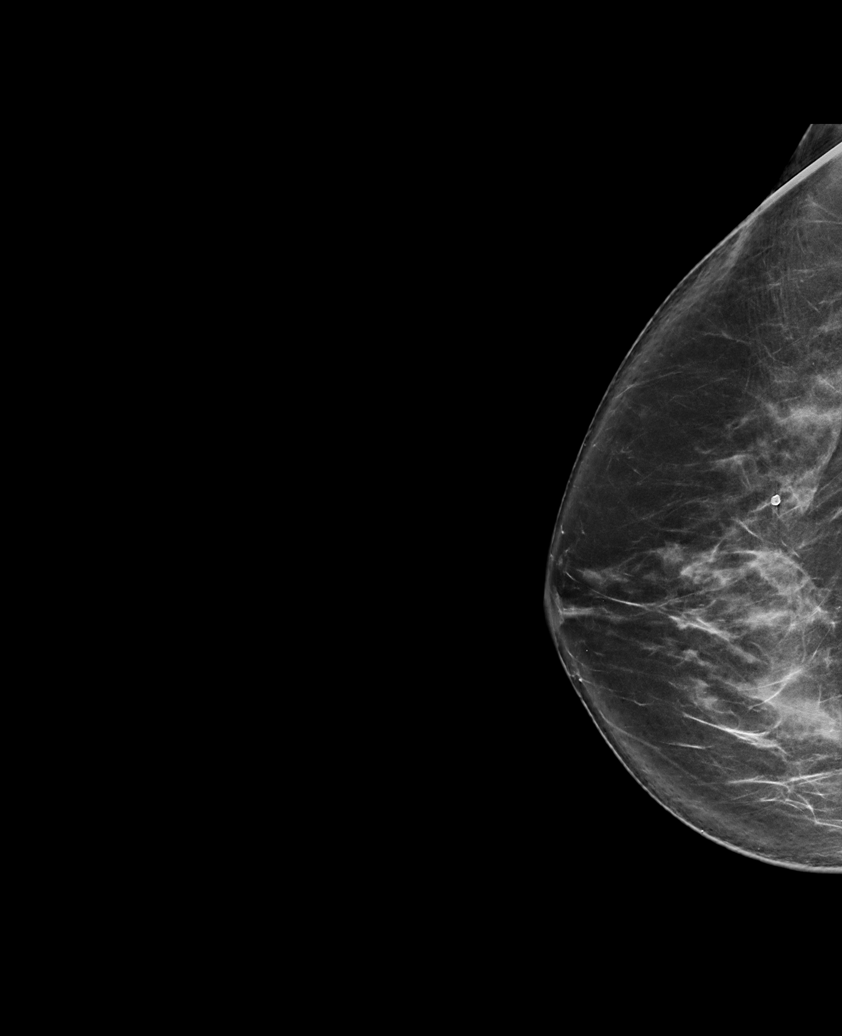

[L MLO synth-2D]
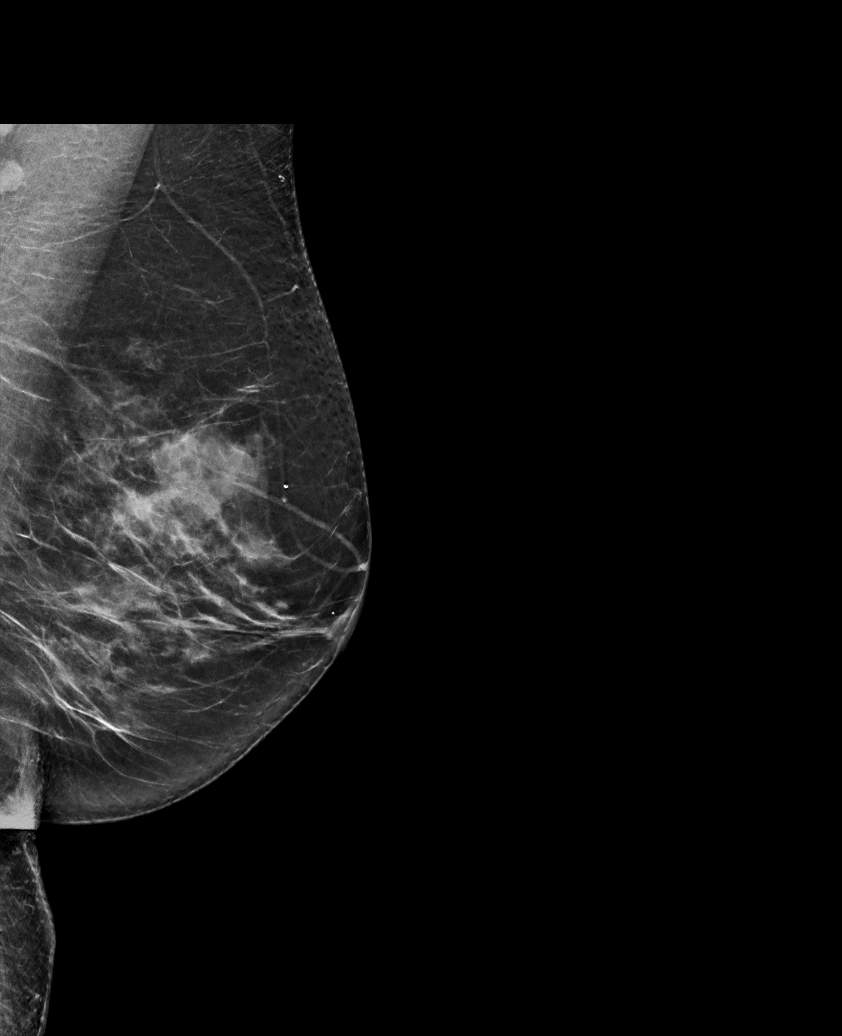

[R MLO synth-2D]
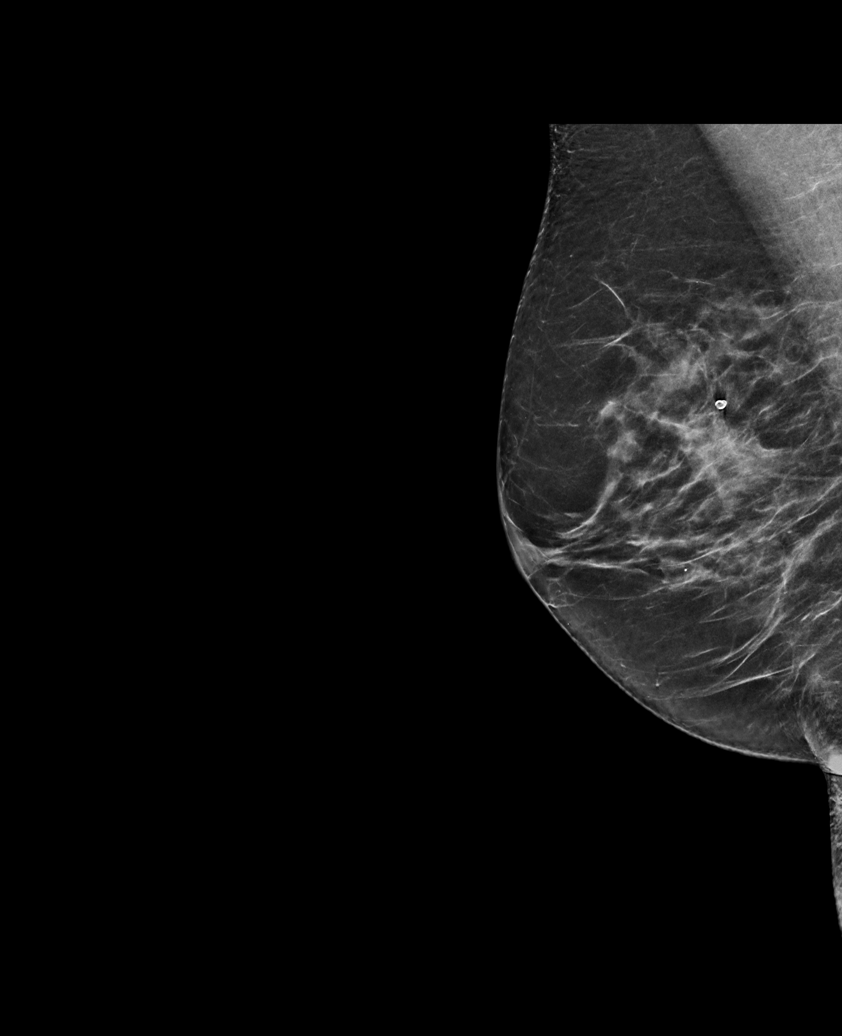

[L CC synth-2D]
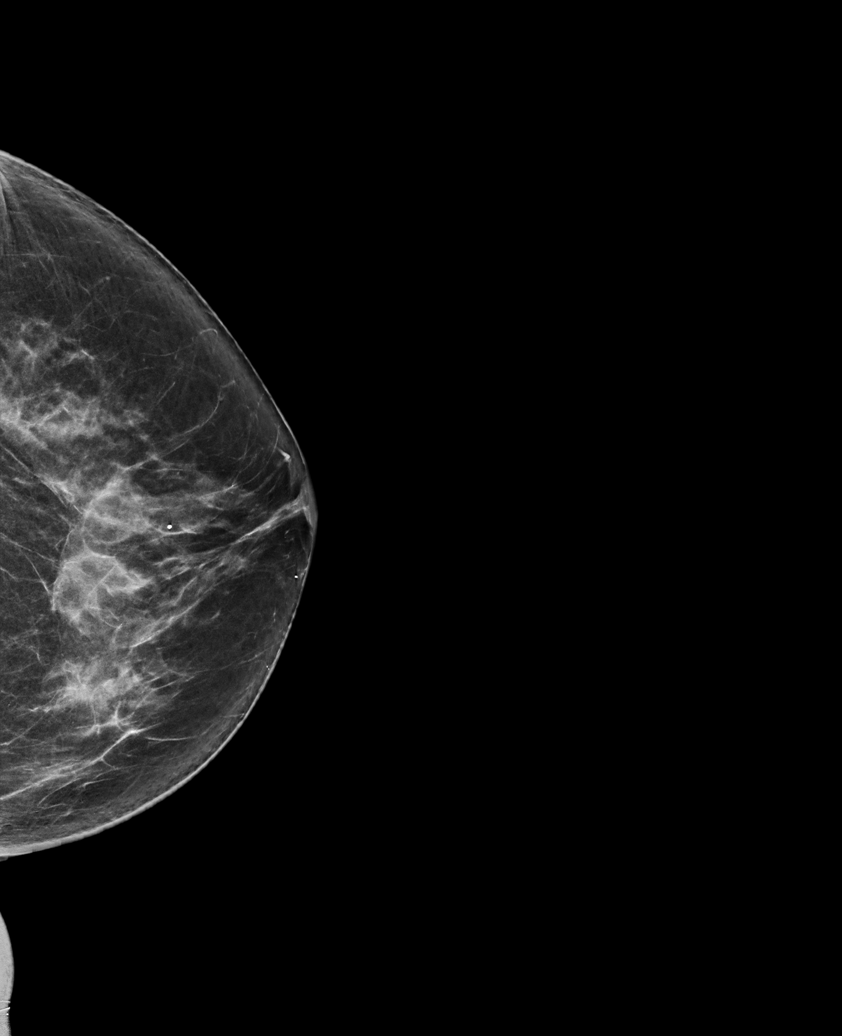

[L MLO tomo · tomo slice 35/70.0]
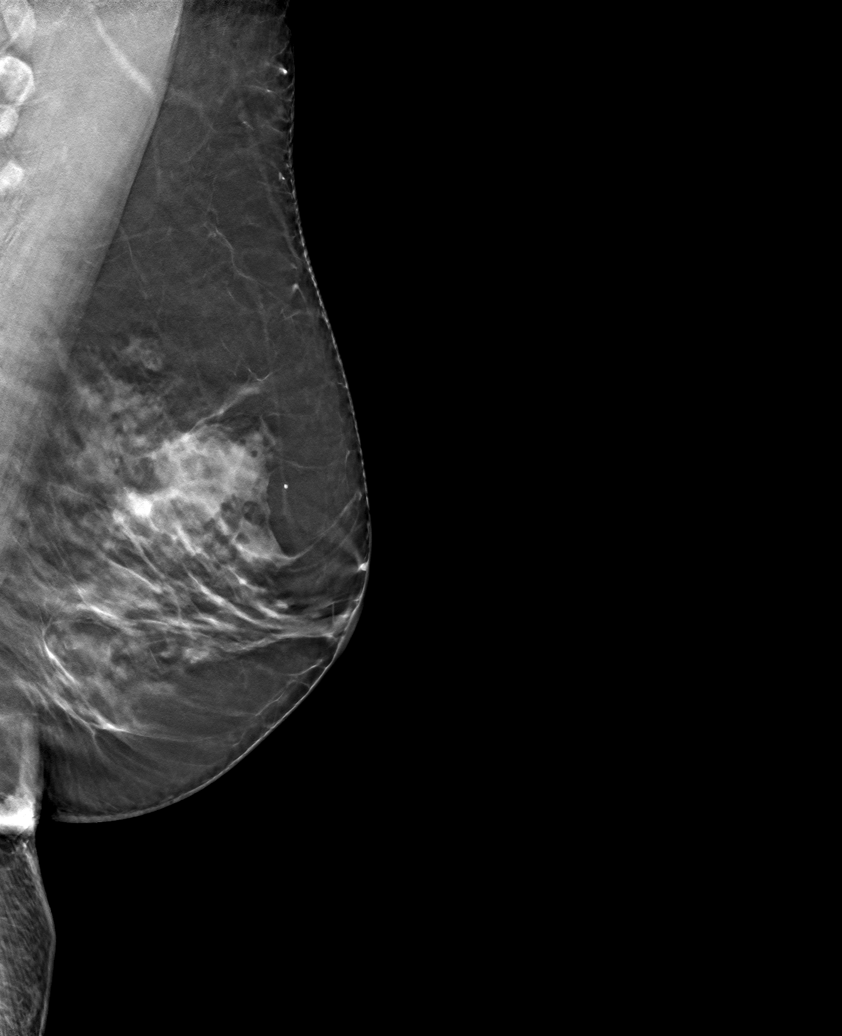

[6 of 30 positions shown; findings below may reference images not displayed]

ACR Breast Density Category c: The breast tissue is heterogeneously
dense, which may obscure small masses.
FINDINGS: There are no findings suspicious for malignancy.
IMPRESSION: No mammographic evidence of malignancy. A result letter of this
screening mammogram will be mailed directly to the patient.

RECOMMENDATION:
Screening mammogram in one year. (Code:Q3-W-BC3)

BI-RADS CATEGORY  1: Negative.

## 2022-04-30 ENCOUNTER — Other Ambulatory Visit: Payer: Self-pay | Admitting: Podiatry

## 2022-04-30 ENCOUNTER — Encounter: Payer: Self-pay | Admitting: Podiatry

## 2022-04-30 ENCOUNTER — Ambulatory Visit (INDEPENDENT_AMBULATORY_CARE_PROVIDER_SITE_OTHER): Payer: BC Managed Care – PPO

## 2022-04-30 ENCOUNTER — Ambulatory Visit: Payer: BC Managed Care – PPO | Admitting: Podiatry

## 2022-04-30 DIAGNOSIS — M778 Other enthesopathies, not elsewhere classified: Secondary | ICD-10-CM | POA: Diagnosis not present

## 2022-04-30 DIAGNOSIS — D509 Iron deficiency anemia, unspecified: Secondary | ICD-10-CM

## 2022-04-30 DIAGNOSIS — R809 Proteinuria, unspecified: Secondary | ICD-10-CM | POA: Insufficient documentation

## 2022-04-30 DIAGNOSIS — K219 Gastro-esophageal reflux disease without esophagitis: Secondary | ICD-10-CM | POA: Insufficient documentation

## 2022-04-30 DIAGNOSIS — L439 Lichen planus, unspecified: Secondary | ICD-10-CM | POA: Insufficient documentation

## 2022-04-30 DIAGNOSIS — N951 Menopausal and female climacteric states: Secondary | ICD-10-CM

## 2022-04-30 DIAGNOSIS — M2012 Hallux valgus (acquired), left foot: Secondary | ICD-10-CM

## 2022-04-30 DIAGNOSIS — F419 Anxiety disorder, unspecified: Secondary | ICD-10-CM | POA: Insufficient documentation

## 2022-04-30 DIAGNOSIS — E7800 Pure hypercholesterolemia, unspecified: Secondary | ICD-10-CM

## 2022-04-30 DIAGNOSIS — L659 Nonscarring hair loss, unspecified: Secondary | ICD-10-CM

## 2022-04-30 DIAGNOSIS — N3281 Overactive bladder: Secondary | ICD-10-CM | POA: Insufficient documentation

## 2022-04-30 DIAGNOSIS — I10 Essential (primary) hypertension: Secondary | ICD-10-CM

## 2022-04-30 DIAGNOSIS — E669 Obesity, unspecified: Secondary | ICD-10-CM | POA: Insufficient documentation

## 2022-04-30 DIAGNOSIS — G4700 Insomnia, unspecified: Secondary | ICD-10-CM | POA: Insufficient documentation

## 2022-04-30 DIAGNOSIS — G629 Polyneuropathy, unspecified: Secondary | ICD-10-CM | POA: Insufficient documentation

## 2022-04-30 DIAGNOSIS — E1149 Type 2 diabetes mellitus with other diabetic neurological complication: Secondary | ICD-10-CM | POA: Insufficient documentation

## 2022-04-30 HISTORY — DX: Iron deficiency anemia, unspecified: D50.9

## 2022-04-30 HISTORY — DX: Gastro-esophageal reflux disease without esophagitis: K21.9

## 2022-04-30 HISTORY — DX: Menopausal and female climacteric states: N95.1

## 2022-04-30 HISTORY — DX: Overactive bladder: N32.81

## 2022-04-30 HISTORY — DX: Anxiety disorder, unspecified: F41.9

## 2022-04-30 HISTORY — DX: Essential (primary) hypertension: I10

## 2022-04-30 HISTORY — DX: Nonscarring hair loss, unspecified: L65.9

## 2022-04-30 HISTORY — DX: Proteinuria, unspecified: R80.9

## 2022-04-30 HISTORY — DX: Polyneuropathy, unspecified: G62.9

## 2022-04-30 HISTORY — DX: Lichen planus, unspecified: L43.9

## 2022-04-30 HISTORY — DX: Obesity, unspecified: E66.9

## 2022-04-30 HISTORY — DX: Insomnia, unspecified: G47.00

## 2022-04-30 HISTORY — DX: Pure hypercholesterolemia, unspecified: E78.00

## 2022-04-30 NOTE — Progress Notes (Signed)
Subjective:  Patient ID: Brittany Brady, female    DOB: [birth year],  MRN: [id number] HPI Chief Complaint  Patient presents with   Foot Pain    1st MPJ left - bunion deformity x years, only bothersome if she has tight or dress type shoes on, PCP wanted to have it checked   New Patient (Initial Visit)    67 y.o. female presents with the above complaint.   ROS: Denies fever chills nausea vomit muscle aches pains calf pain back pain chest pain shortness of breath  No past medical history on file. No past surgical history on file.  Current Outpatient Medications:    amLODipine (NORVASC) 10 MG tablet, TAKE 1 TABLET BY MOUTH EVERY DAY IN THE MORNING, Disp: , Rfl:    metoprolol tartrate (LOPRESSOR) 50 MG tablet, Take 1 tablet by mouth 2 (two) times daily., Disp: , Rfl:    telmisartan (MICARDIS) 80 MG tablet, TAKE 1 TABLET BY MOUTH EVERY DAY IN THE MORNING, Disp: , Rfl:    dexlansoprazole (DEXILANT) 60 MG capsule, Dexilant 60 mg capsule, delayed release  Take 1 capsule every day by oral route for 56 days., Disp: , Rfl:    DULoxetine (CYMBALTA) 60 MG capsule, duloxetine 60 mg capsule,delayed release  Take 1 capsule every day by oral route., Disp: , Rfl:    famotidine (PEPCID) 40 MG tablet, TAKE 1 TABLET BY MOUTH EVERYDAY AT BEDTIME, Disp: , Rfl:    metFORMIN (GLUCOPHAGE) 500 MG tablet, Take by mouth 2 (two) times daily with a meal., Disp: , Rfl:    oxybutynin (DITROPAN-XL) 10 MG 24 hr tablet, oxybutynin chloride ER 10 mg tablet,extended release 24 hr  Take 1 tablet every day by oral route at noon., Disp: , Rfl:    RABEprazole (ACIPHEX) 20 MG tablet, rabeprazole 20 mg tablet,delayed release  Take 1 tablet every day by oral route., Disp: , Rfl:    simvastatin (ZOCOR) 20 MG tablet, simvastatin 20 mg tablet  Take 1 tablet every day by oral route., Disp: , Rfl:    VAGIFEM 10 MCG TABS vaginal tablet, Place one tablet in vagina three times weekly for 2 weeks - then thereafter twice weekly, Disp: , Rfl:     valsartan (DIOVAN) 320 MG tablet, valsartan 320 mg tablet  Take 1 tablet every day by oral route., Disp: , Rfl:   Allergies  Allergen Reactions   Lisinopril Other (See Comments)   Vitamin A Other (See Comments)   Review of Systems Objective:  There were no vitals filed for this visit.  General: Well developed, nourished, in no acute distress, alert and oriented x3   Dermatological: Skin is warm, dry and supple bilateral. Nails x 10 are well maintained; remaining integument appears unremarkable at this time. There are no open sores, no preulcerative lesions, no rash or signs of infection present.  Vascular: Dorsalis Pedis artery and Posterior Tibial artery pedal pulses are 2/4 bilateral with immedate capillary fill time. Pedal hair growth present. No varicosities and no lower extremity edema present bilateral.   Neruologic: Grossly intact via light touch bilateral. Vibratory intact via tuning fork bilateral. Protective threshold with Semmes Wienstein monofilament intact to all pedal sites bilateral. Patellar and Achilles deep tendon reflexes 2+ bilateral. No Babinski or clonus noted bilateral.   Musculoskeletal: No gross boney pedal deformities bilateral. No pain, crepitus, or limitation noted with foot and ankle range of motion bilateral. Muscular strength 5/5 in all groups tested bilateral.  Hallux abductovalgus deformity bilateral left greater than right.  She has good full range of motion with minimal tenderness on palpation of the hypertrophic medial condyle.  No crepitation is noted.  There is no swelling. Gait: Unassisted, Nonantalgic.    Radiographs:  Radiographs demonstrate osseously mature individual with hallux abductovalgus deformity increase in the first intermetatarsal angle greater than 15 degrees and minimal hyper atrophic medial condyle.  Assessment & Plan:   Assessment: Hallux abductovalgus deformity.  Plan: Discussed conservative therapies appropriate shoe gear  and surgical therapy.  Follow-up with [place] as needed     Lamesha Tibbits T. Ranger, [place]

## 2022-07-07 ENCOUNTER — Other Ambulatory Visit: Payer: Self-pay | Admitting: Family Medicine

## 2022-07-07 DIAGNOSIS — Z8249 Family history of ischemic heart disease and other diseases of the circulatory system: Secondary | ICD-10-CM

## 2022-07-08 ENCOUNTER — Other Ambulatory Visit: Payer: Self-pay | Admitting: Family Medicine

## 2022-07-08 DIAGNOSIS — M810 Age-related osteoporosis without current pathological fracture: Secondary | ICD-10-CM

## 2022-07-16 ENCOUNTER — Ambulatory Visit
Admission: RE | Admit: 2022-07-16 | Discharge: 2022-07-16 | Disposition: A | Discharge status: Home / Self Care | Payer: BC Managed Care – PPO | Source: Ambulatory Visit | Attending: Family Medicine | Admitting: Family Medicine

## 2022-07-16 DIAGNOSIS — Z8249 Family history of ischemic heart disease and other diseases of the circulatory system: Secondary | ICD-10-CM

## 2022-07-26 ENCOUNTER — Ambulatory Visit
Admission: EM | Admit: 2022-07-26 | Discharge: 2022-07-26 | Disposition: A | Discharge status: Home / Self Care | Payer: BC Managed Care – PPO | Attending: Urgent Care | Admitting: Urgent Care

## 2022-07-26 DIAGNOSIS — S30860A Insect bite (nonvenomous) of lower back and pelvis, initial encounter: Secondary | ICD-10-CM | POA: Diagnosis not present

## 2022-07-26 DIAGNOSIS — W57XXXA Bitten or stung by nonvenomous insect and other nonvenomous arthropods, initial encounter: Secondary | ICD-10-CM

## 2022-07-26 MED ORDER — DOXYCYCLINE HYCLATE 100 MG PO CAPS: 200.0000 mg | ORAL_CAPSULE | Freq: Once | ORAL | 0 refills | Status: AC

## 2022-07-26 NOTE — ED Triage Notes (Signed)
Patient states she noticed a tick to the left groin region yesterday and now has swelling to the area. The patient sates she did remove the tick.   Home interventions: alcohol rinse

## 2022-07-26 NOTE — ED Provider Notes (Signed)
Brittany Brady    CSN: [id number] Arrival date & time: 07/26/22  1309      History   Chief Complaint Chief Complaint  Patient presents with   Tick Removal    Entered by patient   Insect Bite    HPI Brittany Brady is a 67 y.o. female.   HPI  Presents to urgent care with complaint of tick in her left groin region yesterday and endorses swelling in the area today.  Patient confirms removal of the tick and cleaning the area with alcohol.  She states she has been working outside the last few days and unsure of when the tick was first attached.  She is pretty certain that it was not there on Thursday so would have been attached less than 24 hours when it was noted and removed.  History reviewed. No pertinent past medical history.  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 No pertinent surgical history.  OB History   No obstetric history on file.      Home Medications    Prior to Admission medications   Medication Sig Start Date End Date Taking? Authorizing Provider  amLODipine (NORVASC) 10 MG tablet TAKE 1 TABLET BY MOUTH EVERY DAY IN THE MORNING 12/16/19   [provider]  dexlansoprazole (DEXILANT) 60 MG capsule Dexilant 60 mg capsule, delayed release  Take 1 capsule every day by oral route for 56 days.    [provider]  DULoxetine (CYMBALTA) 60 MG capsule duloxetine 60 mg  capsule,delayed release  Take 1 capsule every day by oral route.    [provider]  famotidine (PEPCID) 40 MG tablet TAKE 1 TABLET BY MOUTH EVERYDAY AT BEDTIME    [provider]  metFORMIN (GLUCOPHAGE) 500 MG tablet Take by mouth 2 (two) times daily with a meal.    [provider]  metoprolol tartrate (LOPRESSOR) 50 MG tablet Take 1 tablet by mouth 2 (two) times daily. 11/12/19   [provider]  oxybutynin (DITROPAN-XL) 10 MG 24 hr tablet oxybutynin chloride ER 10 mg tablet,extended release 24 hr  Take 1 tablet every day by oral route at noon.    [provider]  RABEprazole (ACIPHEX) 20 MG tablet rabeprazole 20 mg tablet,delayed release  Take 1 tablet every day by oral route.    [provider]  simvastatin (ZOCOR) 20 MG tablet simvastatin 20 mg tablet  Take 1 tablet every day by oral route.    [provider]  telmisartan (MICARDIS) 80 MG tablet TAKE 1 TABLET BY MOUTH EVERY DAY IN THE MORNING 01/08/20   [provider]  VAGIFEM 10 MCG TABS vaginal tablet Place one tablet in vagina three times weekly for 2 weeks - then thereafter twice weekly    [provider]  valsartan (DIOVAN) 320 MG tablet valsartan 320 mg tablet  Take 1 tablet every day by oral route.    [provider]    Family History History reviewed.  No pertinent family history.  Social History Social History   Tobacco Use   Smoking status: Never   Smokeless tobacco: Never  Substance Use Topics   Alcohol use: Yes    Alcohol/week: 3.0 standard drinks of alcohol    Types: 2 Glasses of wine, 1 Cans of beer per week   Drug use: No     Allergies   Lisinopril and Vitamin a   Review of Systems Review of Systems   Physical Exam Triage Vital Signs ED Triage Vitals  Enc Vitals Group     BP 07/26/22 1315 122/72     Pulse Rate 07/26/22 1315 61     Resp 07/26/22 1315 16     Temp 07/26/22 1315 98.6 F (37 C)     Temp Source  07/26/22 1315 Oral     SpO2 07/26/22 1315 97 %     Weight --      Height --      Head Circumference --      Peak Flow --      Pain Score 07/26/22 1314 2     Pain Loc --      Pain Edu? --      Excl. in GC? --    No data found.  Updated Vital Signs BP 122/72 (BP Location: Left Arm)   Pulse 61   Temp 98.6 F (37 C) (Oral)   Resp 16   SpO2 97%   Visual Acuity Right Eye Distance:   Left Eye Distance:   Bilateral Distance:    Right Eye Near:   Left Eye Near:    Bilateral Near:     Physical Exam Vitals reviewed.  Constitutional:      Appearance: Normal appearance.  Genitourinary:   Skin:    Findings: Erythema and rash present.  Neurological:     General: No focal deficit present.     Mental Status: She is alert and oriented to person, place, and time.  Psychiatric:        Mood and Affect: Mood normal.        Behavior: Behavior normal.      UC Treatments / Results  Labs (all labs ordered are listed, but only abnormal results are displayed) Labs Reviewed - No data to display  EKG   Radiology No results found.  Procedures Procedures (including critical care time)  Medications Ordered in UC Medications - No data to display  Initial Impression / Assessment and Plan / UC Course  I have reviewed the triage vital signs and the nursing notes.  Pertinent labs & imaging results that were available during my care of the patient were reviewed by me and considered in my medical decision making (see chart for details).   Low suspicion for tickborne illness but will prescribe prophylactic dose of doxycycline to prevent.  Reviewed chart history.  Counseled patient on potential for adverse effects with medications prescribed/recommended today, ER and return-to-clinic precautions discussed, patient verbalized understanding and agreement with care plan.  Final Clinical Impressions(s) / UC Diagnoses   Final diagnoses:  None   Discharge Instructions   None     ED Prescriptions   None    PDMP not reviewed this encounter.   Charma Igo, [place] 07/26/22 1334

## 2022-07-26 NOTE — Discharge Instructions (Signed)
Follow up here or with your primary care provider if your symptoms are worsening or not improving with treatment.     

## 2022-07-30 ENCOUNTER — Other Ambulatory Visit: Payer: Self-pay | Admitting: Family Medicine

## 2022-07-30 DIAGNOSIS — Z0000 Encounter for general adult medical examination without abnormal findings: Secondary | ICD-10-CM

## 2022-09-08 ENCOUNTER — Ambulatory Visit
Admission: RE | Admit: 2022-09-08 | Discharge: 2022-09-08 | Disposition: A | Discharge status: Home / Self Care | Payer: BC Managed Care – PPO | Source: Ambulatory Visit | Attending: Family Medicine | Admitting: Family Medicine

## 2022-09-08 DIAGNOSIS — Z0000 Encounter for general adult medical examination without abnormal findings: Secondary | ICD-10-CM

## 2023-01-21 ENCOUNTER — Ambulatory Visit
Admission: RE | Admit: 2023-01-21 | Discharge: 2023-01-21 | Disposition: A | Discharge status: Home / Self Care | Payer: BC Managed Care – PPO | Source: Ambulatory Visit | Attending: Family Medicine | Admitting: Family Medicine

## 2023-01-21 DIAGNOSIS — E2839 Other primary ovarian failure: Secondary | ICD-10-CM | POA: Diagnosis not present

## 2023-01-21 DIAGNOSIS — M810 Age-related osteoporosis without current pathological fracture: Secondary | ICD-10-CM

## 2023-01-21 DIAGNOSIS — N958 Other specified menopausal and perimenopausal disorders: Secondary | ICD-10-CM | POA: Diagnosis not present

## 2023-01-21 DIAGNOSIS — M8588 Other specified disorders of bone density and structure, other site: Secondary | ICD-10-CM | POA: Diagnosis not present

## 2023-01-28 DIAGNOSIS — H01009 Unspecified blepharitis unspecified eye, unspecified eyelid: Secondary | ICD-10-CM | POA: Diagnosis not present

## 2023-01-28 DIAGNOSIS — L661 Lichen planopilaris, unspecified: Secondary | ICD-10-CM | POA: Diagnosis not present

## 2023-03-25 DIAGNOSIS — E063 Autoimmune thyroiditis: Secondary | ICD-10-CM | POA: Diagnosis not present

## 2023-03-25 DIAGNOSIS — E039 Hypothyroidism, unspecified: Secondary | ICD-10-CM | POA: Diagnosis not present

## 2023-03-25 DIAGNOSIS — R799 Abnormal finding of blood chemistry, unspecified: Secondary | ICD-10-CM | POA: Diagnosis not present

## 2023-03-25 DIAGNOSIS — E2749 Other adrenocortical insufficiency: Secondary | ICD-10-CM | POA: Diagnosis not present

## 2023-03-25 DIAGNOSIS — E782 Mixed hyperlipidemia: Secondary | ICD-10-CM | POA: Diagnosis not present

## 2023-03-25 DIAGNOSIS — Z7689 Persons encountering health services in other specified circumstances: Secondary | ICD-10-CM | POA: Diagnosis not present

## 2023-03-25 DIAGNOSIS — R946 Abnormal results of thyroid function studies: Secondary | ICD-10-CM | POA: Diagnosis not present

## 2023-03-25 DIAGNOSIS — R5383 Other fatigue: Secondary | ICD-10-CM | POA: Diagnosis not present

## 2023-03-25 DIAGNOSIS — E559 Vitamin D deficiency, unspecified: Secondary | ICD-10-CM | POA: Diagnosis not present

## 2023-03-25 DIAGNOSIS — E7841 Elevated Lipoprotein(a): Secondary | ICD-10-CM | POA: Diagnosis not present

## 2023-03-25 DIAGNOSIS — D6489 Other specified anemias: Secondary | ICD-10-CM | POA: Diagnosis not present

## 2023-03-25 DIAGNOSIS — R947 Abnormal results of other endocrine function studies: Secondary | ICD-10-CM | POA: Diagnosis not present

## 2023-05-20 DIAGNOSIS — L661 Lichen planopilaris, unspecified: Secondary | ICD-10-CM | POA: Diagnosis not present

## 2023-05-20 DIAGNOSIS — L570 Actinic keratosis: Secondary | ICD-10-CM | POA: Diagnosis not present

## 2023-07-15 DIAGNOSIS — L661 Lichen planopilaris, unspecified: Secondary | ICD-10-CM | POA: Diagnosis not present

## 2023-07-17 DIAGNOSIS — R430 Anosmia: Secondary | ICD-10-CM | POA: Diagnosis not present

## 2023-07-17 DIAGNOSIS — R432 Parageusia: Secondary | ICD-10-CM | POA: Diagnosis not present

## 2023-07-17 DIAGNOSIS — F330 Major depressive disorder, recurrent, mild: Secondary | ICD-10-CM | POA: Diagnosis not present

## 2023-07-17 DIAGNOSIS — N3281 Overactive bladder: Secondary | ICD-10-CM | POA: Diagnosis not present

## 2023-07-17 DIAGNOSIS — I10 Essential (primary) hypertension: Secondary | ICD-10-CM | POA: Diagnosis not present

## 2023-07-17 DIAGNOSIS — E1149 Type 2 diabetes mellitus with other diabetic neurological complication: Secondary | ICD-10-CM | POA: Diagnosis not present

## 2023-07-17 DIAGNOSIS — L661 Lichen planopilaris, unspecified: Secondary | ICD-10-CM | POA: Diagnosis not present

## 2023-09-15 DIAGNOSIS — L661 Lichen planopilaris, unspecified: Secondary | ICD-10-CM | POA: Diagnosis not present

## 2023-09-30 DIAGNOSIS — D6489 Other specified anemias: Secondary | ICD-10-CM | POA: Diagnosis not present

## 2023-09-30 DIAGNOSIS — R947 Abnormal results of other endocrine function studies: Secondary | ICD-10-CM | POA: Diagnosis not present

## 2023-09-30 DIAGNOSIS — R7301 Impaired fasting glucose: Secondary | ICD-10-CM | POA: Diagnosis not present

## 2023-09-30 DIAGNOSIS — R5383 Other fatigue: Secondary | ICD-10-CM | POA: Diagnosis not present

## 2023-09-30 DIAGNOSIS — E559 Vitamin D deficiency, unspecified: Secondary | ICD-10-CM | POA: Diagnosis not present

## 2023-09-30 DIAGNOSIS — E063 Autoimmune thyroiditis: Secondary | ICD-10-CM | POA: Diagnosis not present

## 2023-09-30 DIAGNOSIS — R946 Abnormal results of thyroid function studies: Secondary | ICD-10-CM | POA: Diagnosis not present

## 2023-09-30 DIAGNOSIS — E782 Mixed hyperlipidemia: Secondary | ICD-10-CM | POA: Diagnosis not present

## 2023-09-30 DIAGNOSIS — E039 Hypothyroidism, unspecified: Secondary | ICD-10-CM | POA: Diagnosis not present

## 2023-09-30 DIAGNOSIS — Z7689 Persons encountering health services in other specified circumstances: Secondary | ICD-10-CM | POA: Diagnosis not present

## 2023-09-30 DIAGNOSIS — R799 Abnormal finding of blood chemistry, unspecified: Secondary | ICD-10-CM | POA: Diagnosis not present

## 2023-09-30 DIAGNOSIS — E7841 Elevated Lipoprotein(a): Secondary | ICD-10-CM | POA: Diagnosis not present

## 2023-10-13 DIAGNOSIS — Z01411 Encounter for gynecological examination (general) (routine) with abnormal findings: Secondary | ICD-10-CM | POA: Diagnosis not present

## 2023-10-13 DIAGNOSIS — Z124 Encounter for screening for malignant neoplasm of cervix: Secondary | ICD-10-CM | POA: Diagnosis not present

## 2023-10-13 DIAGNOSIS — N952 Postmenopausal atrophic vaginitis: Secondary | ICD-10-CM | POA: Diagnosis not present

## 2023-10-13 DIAGNOSIS — N3281 Overactive bladder: Secondary | ICD-10-CM | POA: Diagnosis not present

## 2023-10-20 DIAGNOSIS — E119 Type 2 diabetes mellitus without complications: Secondary | ICD-10-CM | POA: Diagnosis not present

## 2023-10-20 DIAGNOSIS — H52222 Regular astigmatism, left eye: Secondary | ICD-10-CM | POA: Diagnosis not present

## 2023-10-20 DIAGNOSIS — H35033 Hypertensive retinopathy, bilateral: Secondary | ICD-10-CM | POA: Diagnosis not present

## 2023-10-20 DIAGNOSIS — H524 Presbyopia: Secondary | ICD-10-CM | POA: Diagnosis not present

## 2023-10-20 DIAGNOSIS — H59811 Chorioretinal scars after surgery for detachment, right eye: Secondary | ICD-10-CM | POA: Diagnosis not present

## 2023-10-20 DIAGNOSIS — H2513 Age-related nuclear cataract, bilateral: Secondary | ICD-10-CM | POA: Diagnosis not present

## 2023-10-26 DIAGNOSIS — L661 Lichen planopilaris, unspecified: Secondary | ICD-10-CM | POA: Diagnosis not present

## 2023-11-02 DIAGNOSIS — E7800 Pure hypercholesterolemia, unspecified: Secondary | ICD-10-CM | POA: Diagnosis not present

## 2023-11-02 DIAGNOSIS — E1149 Type 2 diabetes mellitus with other diabetic neurological complication: Secondary | ICD-10-CM | POA: Diagnosis not present

## 2023-11-02 DIAGNOSIS — R809 Proteinuria, unspecified: Secondary | ICD-10-CM | POA: Diagnosis not present

## 2023-11-02 DIAGNOSIS — Z23 Encounter for immunization: Secondary | ICD-10-CM | POA: Diagnosis not present

## 2023-11-02 DIAGNOSIS — I10 Essential (primary) hypertension: Secondary | ICD-10-CM | POA: Diagnosis not present

## 2023-11-02 DIAGNOSIS — D509 Iron deficiency anemia, unspecified: Secondary | ICD-10-CM | POA: Diagnosis not present

## 2023-11-02 DIAGNOSIS — F330 Major depressive disorder, recurrent, mild: Secondary | ICD-10-CM | POA: Diagnosis not present

## 2023-11-02 DIAGNOSIS — G4700 Insomnia, unspecified: Secondary | ICD-10-CM | POA: Diagnosis not present

## 2023-11-02 DIAGNOSIS — F419 Anxiety disorder, unspecified: Secondary | ICD-10-CM | POA: Diagnosis not present

## 2023-11-02 DIAGNOSIS — R931 Abnormal findings on diagnostic imaging of heart and coronary circulation: Secondary | ICD-10-CM | POA: Diagnosis not present

## 2023-11-02 DIAGNOSIS — I700 Atherosclerosis of aorta: Secondary | ICD-10-CM | POA: Diagnosis not present

## 2023-11-02 DIAGNOSIS — Z0000 Encounter for general adult medical examination without abnormal findings: Secondary | ICD-10-CM | POA: Diagnosis not present

## 2023-12-10 ENCOUNTER — Other Ambulatory Visit: Payer: Self-pay | Admitting: Family Medicine

## 2023-12-10 DIAGNOSIS — Z1231 Encounter for screening mammogram for malignant neoplasm of breast: Secondary | ICD-10-CM

## 2023-12-15 ENCOUNTER — Ambulatory Visit
Admission: RE | Admit: 2023-12-15 | Discharge: 2023-12-15 | Disposition: A | Discharge status: Home / Self Care | Source: Ambulatory Visit | Attending: Family Medicine | Admitting: Family Medicine

## 2023-12-15 DIAGNOSIS — Z1231 Encounter for screening mammogram for malignant neoplasm of breast: Secondary | ICD-10-CM | POA: Diagnosis not present

## 2023-12-21 DIAGNOSIS — L661 Lichen planopilaris, unspecified: Secondary | ICD-10-CM | POA: Diagnosis not present

## 2023-12-21 DIAGNOSIS — L989 Disorder of the skin and subcutaneous tissue, unspecified: Secondary | ICD-10-CM | POA: Diagnosis not present

## 2023-12-30 DIAGNOSIS — L239 Allergic contact dermatitis, unspecified cause: Secondary | ICD-10-CM | POA: Diagnosis not present

## 2024-01-17 NOTE — Progress Notes (Unsigned)
 Cardiology Office Note:    Date:  01/18/2024   ID:  Brittany Brady, DOB December 14, 1955, MRN [id number]  PCP:  Cristopher Bottcher, NP   [facility] Providers Cardiologist:  None     Referring MD: Cristopher Bottcher, NP   Chief Complaint  Patient presents with   Coronary Artery Disease    History of Present Illness:    Brittany Brady is a 68 y.o. female seen at the request of Bottcher Cristopher NP for evaluation of coronary artery calcification. She has a history of DM, HTN, GERD, HLD, obesity. Coronary calcium score last year was 170. Also noted aortic calcification and large hiatal hernia.   She states she feels very well. Goes to the gym twice a week and does heavy yard work- raking, mowing, lifting without any complaints. She rarely has GERD symptoms. She was on Ozempic but lost down to 140 lbs so came off it.   Past Medical History:  Diagnosis Date   Alopecia 04/30/2022   Anxiety 04/30/2022   Aortic atherosclerosis    Depression    DM (diabetes mellitus) (HCC)    Essential hypertension 04/30/2022   Gastro-esophageal reflux disease without esophagitis 04/30/2022   Insomnia 04/30/2022   Insomnia    Iron deficiency anemia 04/30/2022   Iron deficiency anemia    Lichen planus 04/30/2022   Menopausal symptom 04/30/2022   Microalbuminuria 04/30/2022   OAB (overactive bladder)    Obesity with body mass index 30 or greater 04/30/2022   Overactive bladder 04/30/2022   Peripheral neuropathy 04/30/2022   Pure hypercholesterolemia 04/30/2022    History reviewed. No pertinent surgical history.  Current Medications: Current Meds  Medication Sig   amLODipine (NORVASC) 10 MG tablet TAKE 1 TABLET BY MOUTH EVERY DAY IN THE MORNING   aspirin EC 81 MG tablet Take 1 tablet (81 mg total) by mouth daily. Swallow whole.   DULoxetine (CYMBALTA) 60 MG capsule duloxetine 60 mg capsule,delayed release  Take 1 capsule every day by oral route.   metFORMIN (GLUCOPHAGE) 500 MG tablet Take by mouth 2  (two) times daily with a meal.   metoprolol tartrate (LOPRESSOR) 50 MG tablet Take 1 tablet by mouth 2 (two) times daily.   oxybutynin (DITROPAN-XL) 10 MG 24 hr tablet oxybutynin chloride ER 10 mg tablet,extended release 24 hr  Take 1 tablet every day by oral route at noon.   RABEprazole (ACIPHEX) 20 MG tablet rabeprazole 20 mg tablet,delayed release  Take 1 tablet every day by oral route.   rosuvastatin (CRESTOR) 20 MG tablet Take 1 tablet (20 mg total) by mouth daily.   telmisartan (MICARDIS) 80 MG tablet TAKE 1 TABLET BY MOUTH EVERY DAY IN THE MORNING   VAGIFEM 10 MCG TABS vaginal tablet Place one tablet in vagina three times weekly for 2 weeks - then thereafter twice weekly   [DISCONTINUED] dexlansoprazole (DEXILANT) 60 MG capsule Dexilant 60 mg capsule, delayed release  Take 1 capsule every day by oral route for 56 days.   [DISCONTINUED] doxycycline  (VIBRAMYCIN ) 100 MG capsule Take 100 mg by mouth daily.   [DISCONTINUED] simvastatin (ZOCOR) 20 MG tablet simvastatin 20 mg tablet  Take 1 tablet every day by oral route.   [DISCONTINUED] valsartan (DIOVAN) 320 MG tablet valsartan 320 mg tablet  Take 1 tablet every day by oral route.     Allergies:   Lisinopril, Ozempic (0.25 or 0.5 mg-dose) [semaglutide(0.25 or 0.5mg -dos)], Retinoic acid [tretinoin], and Vitamin a   Social History   Socioeconomic History   Marital  status: Widowed    Spouse name: Not on file   Number of children: 2   Years of education: Not on file   Highest education level: Not on file  Occupational History   Not on file  Tobacco Use   Smoking status: Never   Smokeless tobacco: Never  Substance and Sexual Activity   Alcohol use: Yes    Alcohol/week: 3.0 standard drinks of alcohol    Types: 2 Glasses of wine, 1 Cans of beer per week   Drug use: No   Sexual activity: Not on file  Other Topics Concern   Not on file  Social History Narrative   Retired diplomatic services operational officer for [employer].    Social Drivers of  Corporate Investment Banker Strain: Not on file  Food Insecurity: Not on file  Transportation Needs: Not on file  Physical Activity: Not on file  Stress: Not on file  Social Connections: Unknown (07/29/2021)   Received from [facility]   Social Network    Social Network: Not on file     Family History: The patient's family history includes AAA (abdominal aortic aneurysm) in her mother; Atrial fibrillation in her brother; COPD in her sister; Cancer in her father; Diabetes in her brother and brother; Heart attack in her brother; Heart attack (age of onset: 59) in her mother; Hypertension in her brother and father; Lung cancer in her father.  ROS:   Please see the history of present illness.     All other systems reviewed and are negative.  EKGs/Labs/Other Studies Reviewed:    The following studies were reviewed today:  EKG Interpretation Date/Time:  Monday January 18 2024 08:26:07 EST Ventricular Rate:  87 PR Interval:  132 QRS Duration:  90 QT Interval:  362 QTC Calculation: 435 R Axis:   57  Text Interpretation: Normal sinus rhythm Normal ECG When compared with ECG of 20-Nov-2009 23:34, No significant change was found Confirmed by Aja Whitehair [phone]) on 01/18/2024 8:30:05 AM   Coronary calcium score. 07/16/22: CT CARDIAC CORONARY ARTERY CALCIUM SCORE   TECHNIQUE: Non-contrast imaging through the heart was performed using prospective ECG gating. Image post processing was performed on an independent workstation, allowing for quantitative analysis of the heart and coronary arteries. Note that this exam targets the heart and the chest was not imaged in its entirety.   COMPARISON:  None available.   FINDINGS: CORONARY CALCIUM SCORES:   Left Main: 0   LAD: 102   LCx: 8.4   RCA: 59.8   Total Agatston Score: 170   MESA database percentile: 88   AORTA MEASUREMENTS:   Ascending Aorta: 3.0 cm   Descending Aorta:2.3 cm   OTHER FINDINGS:   Heart is normal  size. Aorta normal caliber. Scattered moderate aortic atherosclerosis. No adenopathy. Large hiatal hernia. No confluent opacities or effusions. No acute findings in the upper abdomen. Chest wall soft tissues are unremarkable. No acute bony abnormality.   IMPRESSION: Total Agatston score: 170   [place] database percentile: 88   No acute extra cardiac abnormality.   Large hiatal hernia.   Aortic atherosclerosis.     Electronically Signed   By: Franky Crease M.D.   On: 07/16/2022 15:11   EKG Interpretation Date/Time:  Monday January 18 2024 08:26:07 EST Ventricular Rate:  87 PR Interval:  132 QRS Duration:  90 QT Interval:  362 QTC Calculation: 435 R Axis:   57  Text Interpretation: Normal sinus rhythm Normal ECG When compared with ECG of  20-Nov-2009 23:34, No significant change was found Confirmed by Vashon Arch [phone]) on 01/18/2024 8:30:05 AM    Recent Labs: No results found for requested labs within last 365 days.  Recent Lipid Panel No results found for: CHOL, TRIG, HDL, CHOLHDL, VLDL, LDLCALC, LDLDIRECT  Dated 11/02/23: cholesterol 173, Triglycerides 112, HDL 69, LDL 84. A1c 6.6%. CMET and CBC normal.  Risk Assessment/Calculations:                Physical Exam:    VS:  BP 120/60   Pulse 87   Ht 5' 5.05 (1.652 m)   Wt 157 lb 4.8 oz (71.4 kg)   SpO2 98%   BMI 26.14 kg/m     Wt Readings from Last 3 Encounters:  01/18/24 157 lb 4.8 oz (71.4 kg)  09/24/16 185 lb (83.9 kg)     GEN:  Well nourished, well developed in no acute distress HEENT: Normal NECK: No JVD; No carotid bruits LYMPHATICS: No lymphadenopathy CARDIAC: RRR, no murmurs, rubs, gallops RESPIRATORY:  Clear to auscultation without rales, wheezing or rhonchi  ABDOMEN: Soft, non-tender, non-distended MUSCULOSKELETAL:  No edema; No deformity  SKIN: Warm and dry NEUROLOGIC:  Alert and oriented x 3 PSYCHIATRIC:  Normal affect   ASSESSMENT:    1. Coronary artery disease  involving native coronary artery of native heart without angina pectoris   2. Essential hypertension   3. 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 Pure hypercholesterolemia    PLAN:    In order of problems listed above:  CAD with coronary calcium score of 170. She is completely asymptomatic. Recommend risk factor modification. He weight and sugar are OK. BP is well controlled. LDL 84 is not at goal. Discussed lifestyle modification with appropriate low carb diet and avoidance of processed foods. Regular aerobic exercise. Recommend adding ASA 81 mg daily for secondary prevention. She is to notify us  if she has any new cardiac symptoms. Follow up in one year.  HLD goal LDL < 55. Will switch Zocor to Crestor 20 mg daily. Repeat fasting lab in 3 months.  DM type 2 with neuropathy. On metformin. A1c 6.6%. per PCP HTN well controlled on metoprolol, amlodipine and temisartan. Continue.            Medication Adjustments/Labs and Tests Ordered: Current medicines are reviewed at length with the patient today.  Concerns regarding medicines are outlined above.  Orders Placed This Encounter  Procedures   Basic metabolic panel with GFR   Lipid panel   Hepatic function panel   EKG 12-Lead   Meds ordered this encounter  Medications   rosuvastatin (CRESTOR) 20 MG tablet    Sig: Take 1 tablet (20 mg total) by mouth daily.    Dispense:  90 tablet    Refill:  3   aspirin EC 81 MG tablet    Sig: Take 1 tablet (81 mg total) by mouth daily. Swallow whole.    There are no Patient Instructions on file for this visit.   Signed, Haydon Kalmar, MD  01/18/2024 8:54 AM    [facility]

## 2024-01-18 ENCOUNTER — Ambulatory Visit: Attending: Cardiology | Admitting: Cardiology

## 2024-01-18 ENCOUNTER — Encounter: Payer: Self-pay | Admitting: Cardiology

## 2024-01-18 VITALS — BP 120/60 | HR 87 | Ht 65.05 in | Wt 157.3 lb

## 2024-01-18 DIAGNOSIS — E7800 Pure hypercholesterolemia, unspecified: Secondary | ICD-10-CM | POA: Diagnosis not present

## 2024-01-18 DIAGNOSIS — I10 Essential (primary) hypertension: Secondary | ICD-10-CM | POA: Diagnosis not present

## 2024-01-18 DIAGNOSIS — I2510 Atherosclerotic heart disease of native coronary artery without angina pectoris: Secondary | ICD-10-CM

## 2024-01-18 DIAGNOSIS — E1149 Type 2 diabetes mellitus with other diabetic neurological complication: Secondary | ICD-10-CM | POA: Diagnosis not present

## 2024-01-18 MED ORDER — ASPIRIN 81 MG PO TBEC
81.0000 mg | DELAYED_RELEASE_TABLET | Freq: Every day | ORAL | Status: AC
Start: 2024-01-18 — End: ?

## 2024-01-18 MED ORDER — ROSUVASTATIN CALCIUM 20 MG PO TABS: 20.0000 mg | ORAL_TABLET | Freq: Every day | ORAL | 3 refills | Status: AC

## 2024-01-18 NOTE — Patient Instructions (Signed)
 Medication Instructions:  Stop Simvastatin Start Crestor 20 mg daily  Take baby aspirin 81 mg daily Continue all other medications *If you need a refill on your cardiac medications before your next appointment, please call your pharmacy*  Lab Work: Fasting     Bmet,lipid and hepatic panel to be done in 3 months  Testing/Procedures: None ordered  Follow-Up: At [facility], you and your health needs are our priority.  As part of our continuing mission to provide you with exceptional heart care, our providers are all part of one team.  This team includes your primary Cardiologist (physician) and Advanced Practice Providers or APPs (Physician Assistants and Nurse Practitioners) who all work together to provide you with the care you need, when you need it.  Your next appointment:  1 year    Call in July to schedule Nov appointment     Provider:  Dr.Jordan  We recommend signing up for the patient portal called MyChart.  Sign up information is provided on this After Visit Summary.  MyChart is used to connect with patients for Virtual Visits (Telemedicine).  Patients are able to view lab/test results, encounter notes, upcoming appointments, etc.  Non-urgent messages can be sent to your provider as well.   To learn more about what you can do with MyChart, go to [email or web address].

## 2024-01-21 DIAGNOSIS — L309 Dermatitis, unspecified: Secondary | ICD-10-CM | POA: Diagnosis not present

## 2024-01-21 DIAGNOSIS — L280 Lichen simplex chronicus: Secondary | ICD-10-CM | POA: Diagnosis not present

## 2024-01-21 DIAGNOSIS — D489 Neoplasm of uncertain behavior, unspecified: Secondary | ICD-10-CM | POA: Diagnosis not present

## 2024-01-25 ENCOUNTER — Ambulatory Visit: Admitting: Cardiology

## 2024-01-25 DIAGNOSIS — R947 Abnormal results of other endocrine function studies: Secondary | ICD-10-CM | POA: Diagnosis not present

## 2024-01-25 DIAGNOSIS — R5383 Other fatigue: Secondary | ICD-10-CM | POA: Diagnosis not present

## 2024-01-25 DIAGNOSIS — R946 Abnormal results of thyroid function studies: Secondary | ICD-10-CM | POA: Diagnosis not present

## 2024-01-25 DIAGNOSIS — E7841 Elevated Lipoprotein(a): Secondary | ICD-10-CM | POA: Diagnosis not present

## 2024-01-25 DIAGNOSIS — D6489 Other specified anemias: Secondary | ICD-10-CM | POA: Diagnosis not present

## 2024-01-25 DIAGNOSIS — R7301 Impaired fasting glucose: Secondary | ICD-10-CM | POA: Diagnosis not present

## 2024-01-25 DIAGNOSIS — Z7689 Persons encountering health services in other specified circumstances: Secondary | ICD-10-CM | POA: Diagnosis not present

## 2024-01-25 DIAGNOSIS — E063 Autoimmune thyroiditis: Secondary | ICD-10-CM | POA: Diagnosis not present

## 2024-01-25 DIAGNOSIS — E039 Hypothyroidism, unspecified: Secondary | ICD-10-CM | POA: Diagnosis not present

## 2024-01-25 DIAGNOSIS — E782 Mixed hyperlipidemia: Secondary | ICD-10-CM | POA: Diagnosis not present

## 2024-01-25 DIAGNOSIS — E559 Vitamin D deficiency, unspecified: Secondary | ICD-10-CM | POA: Diagnosis not present

## 2024-01-25 DIAGNOSIS — R799 Abnormal finding of blood chemistry, unspecified: Secondary | ICD-10-CM | POA: Diagnosis not present

## 2024-02-17 DIAGNOSIS — L309 Dermatitis, unspecified: Secondary | ICD-10-CM | POA: Diagnosis not present

## 2024-02-17 DIAGNOSIS — L661 Lichen planopilaris, unspecified: Secondary | ICD-10-CM | POA: Diagnosis not present

## 2024-04-04 ENCOUNTER — Encounter: Payer: Self-pay | Admitting: Podiatry

## 2024-04-04 ENCOUNTER — Ambulatory Visit: Admitting: Podiatry

## 2024-04-04 ENCOUNTER — Ambulatory Visit (INDEPENDENT_AMBULATORY_CARE_PROVIDER_SITE_OTHER)

## 2024-04-04 DIAGNOSIS — M7661 Achilles tendinitis, right leg: Secondary | ICD-10-CM

## 2024-04-04 DIAGNOSIS — M7671 Peroneal tendinitis, right leg: Secondary | ICD-10-CM

## 2024-04-04 MED ORDER — NAPROXEN 500 MG PO TABS: 500.0000 mg | ORAL_TABLET | Freq: Two times a day (BID) | ORAL | 0 refills | Status: AC

## 2024-04-04 NOTE — Progress Notes (Unsigned)
 "  Subjective:  Patient ID: Brittany Brady, female    DOB: 1955/10/19,  MRN: [id number]  Chief Complaint  Patient presents with   Foot Pain    Right foot pain pt stated that Saturday and Sunday she could not put pressure on her foot at all. She stated that she feels the discomfort towards the back of heel when wearing shoes     Discussed the use of AI scribe software for clinical note transcription with the patient, who gave verbal consent to proceed.  History of Present Illness Brittany Brady is a 69 year old female with well-controlled type 2 diabetes who presents with acute right heel and lateral foot pain.  Pain began within the past week with marked worsening over the weekend. On Sunday she was unable to bear weight on the right foot. Today she still has significant difficulty walking, with mild improvement with rest and elevation.  Pain is localized to the posterior and lateral heel with extension along the lateral foot. She denies plantar heel pain and has never had similar pain before. She has not noticed warmth and has not used anti-inflammatory medications.  Pain is provoked by ambulation, especially weightbearing on the lateral foot. Usual mild stiffness that improves after a few steps has been replaced by more severe and persistent pain that limits walking and daily activities.  She is normally active and walks or uses the treadmill at the gym three to four times per week. She denies calf-raise type exercises. Prior imaging showed a bone spur at a tendon insertion, which had been asymptomatic. She denies prior Achilles or peroneal tendon problems.      Objective:    Physical Exam MUSCULOSKELETAL: Pain on palpation of the Achilles tendon and at level of insertion right foot. Pain on dorsiflexion and plantarflexion of the foot. Pain on eversion of the foot. Increased warmth over the Achilles tendon.  Tenderness on palpation of the peroneal tendons as well at ankle level. Decreased  muscle strength secondary due to guarding Localized edema, mild posterior Achilles DP and PT pulses palpable 2/4 bilaterally.  No diffuse pitting edema Light touch sensation intact No open wounds or lesions noted.   No images are attached to the encounter.    Results Radiology Right foot foot X-ray 3 views weightbearing 04/04/2024: Normal osseous mineralization.  No acute fractures.  Joint spaces preserved.  Heel spur formation present posterior calcaneus and plantar calcaneal tubercle.  Kager's triangle intact on lateral view there is bone spur versus accessory ossicle present that appears anterior tibial level, possibly about the medial malleolus not well-visualized on other views.   Assessment:   1. Right Achilles tendinitis   2. Peroneal tendinitis, right      Plan:  Patient was evaluated and treated and all questions answered.  Assessment and Plan Assessment & Plan Achilles tendinitis Subacute Achilles tendinitis with acute exacerbation due to overuse, further aggravated by continued activity. Imaging showed localized tenderness, warmth, and calcaneal spur. Gradual improvement expected with immobilization and rest. - Provided walking boot for immobilization, to be worn as much as possible, removable for driving. - Provided heel pad for 1-2 weeks to reduce tension on the tendon. - Prescribed naproxen  twice daily for two weeks, then as needed. - Provided home stretching exercises to begin immediately, stretch to discomfort not pain. - Advised rest, avoid gym activities and treadmill walking until reassessment. - Instructed use of 'even up' device on contralateral foot if needed. - Advised gradual return to activity after  3-4 weeks if symptoms improve, possible transition out of boot at home. - Scheduled follow-up in 3-4 weeks to reassess progress, consider formal physical therapy if not improved.  Peroneal tendinitis Lateral ankle pain consistent with peroneal tendinitis,  likely secondary to compensatory overuse from Achilles tendinitis. Expected to improve with Achilles tendinitis treatment. - Included peroneal tendon rehabilitation in Achilles tendinitis protocol. - Advised that improvement in Achilles tendinitis should address peroneal symptoms; reassess at follow-up.      Return in about 4 weeks (around 05/02/2024) for achilles tendinitis.    "

## 2024-04-04 NOTE — Patient Instructions (Signed)

## 2024-04-05 ENCOUNTER — Ambulatory Visit: Admitting: Podiatry

## 2024-05-12 ENCOUNTER — Ambulatory Visit: Admitting: Podiatry
